# Patient Record
Sex: Female | Born: 1995 | Race: Black or African American | Hispanic: No | Marital: Single | Smoking: Current every day smoker
Health system: Southern US, Community
[De-identification: ages and names within clinical notes are randomized; demographics above are authoritative.]

## PROBLEM LIST (undated history)

## (undated) DIAGNOSIS — F32A Depression, unspecified: Secondary | ICD-10-CM

## (undated) DIAGNOSIS — D219 Benign neoplasm of connective and other soft tissue, unspecified: Secondary | ICD-10-CM

## (undated) DIAGNOSIS — N946 Dysmenorrhea, unspecified: Secondary | ICD-10-CM

## (undated) DIAGNOSIS — Z5189 Encounter for other specified aftercare: Secondary | ICD-10-CM

## (undated) DIAGNOSIS — D649 Anemia, unspecified: Secondary | ICD-10-CM

## (undated) HISTORY — DX: Dysmenorrhea, unspecified: N94.6

## (undated) HISTORY — DX: Depression, unspecified: F32.A

## (undated) HISTORY — PX: DILATION AND CURETTAGE OF UTERUS: SHX78

## (undated) HISTORY — PX: KNEE SURGERY: SHX244

## (undated) HISTORY — DX: Anemia, unspecified: D64.9

## (undated) HISTORY — DX: Benign neoplasm of connective and other soft tissue, unspecified: D21.9

## (undated) HISTORY — DX: Encounter for other specified aftercare: Z51.89

---

## 2019-05-06 ENCOUNTER — Encounter (HOSPITAL_COMMUNITY): Payer: Self-pay

## 2019-05-06 ENCOUNTER — Other Ambulatory Visit: Payer: Self-pay

## 2019-05-06 ENCOUNTER — Ambulatory Visit (HOSPITAL_COMMUNITY)
Admission: EM | Admit: 2019-05-06 | Discharge: 2019-05-06 | Disposition: A | Discharge status: Home / Self Care | Payer: Self-pay | Attending: Emergency Medicine | Admitting: Emergency Medicine

## 2019-05-06 DIAGNOSIS — R0602 Shortness of breath: Secondary | ICD-10-CM | POA: Insufficient documentation

## 2019-05-06 DIAGNOSIS — F17290 Nicotine dependence, other tobacco product, uncomplicated: Secondary | ICD-10-CM | POA: Insufficient documentation

## 2019-05-06 DIAGNOSIS — R062 Wheezing: Secondary | ICD-10-CM | POA: Insufficient documentation

## 2019-05-06 DIAGNOSIS — J209 Acute bronchitis, unspecified: Secondary | ICD-10-CM | POA: Insufficient documentation

## 2019-05-06 DIAGNOSIS — Z20828 Contact with and (suspected) exposure to other viral communicable diseases: Secondary | ICD-10-CM | POA: Insufficient documentation

## 2019-05-06 MED ORDER — ALBUTEROL SULFATE HFA 108 (90 BASE) MCG/ACT IN AERS
INHALATION_SPRAY | RESPIRATORY_TRACT | Status: AC
  Filled 2019-05-06: qty 6.7

## 2019-05-06 MED ORDER — BENZONATATE 100 MG PO CAPS: 100.0000 mg | ORAL_CAPSULE | Freq: Three times a day (TID) | ORAL | 0 refills | Status: DC | PRN

## 2019-05-06 MED ORDER — PREDNISONE 20 MG PO TABS: 20.0000 mg | ORAL_TABLET | Freq: Every day | ORAL | 0 refills | Status: AC

## 2019-05-06 MED ORDER — ALBUTEROL SULFATE HFA 108 (90 BASE) MCG/ACT IN AERS
2.0000 | INHALATION_SPRAY | Freq: Once | RESPIRATORY_TRACT | Status: AC
  Administered 2019-05-06: 2 via RESPIRATORY_TRACT

## 2019-05-06 NOTE — ED Provider Notes (Signed)
[facility]    CSN: [id number] Arrival date & time: 05/06/19  1231      History   Chief Complaint Chief Complaint  Patient presents with  . Cough    HPI Danielle Foster is a 23 y.o. female.   HPI  Danielle Foster presents today with wheezing, SOB, and coughing x 4 days. Patient is a smoker and endorses a history of bronchitis infections. She is without primary care and doesn't have an inhaler. Cough is productive at times. Symptoms are exacerbated by laying down and worst at bedtime. She has not measured her temperature at home, has low-grade fever here today. Denies any changes to appetite, headache, or N&V. She has also continued to smoke while symptoms have been present. No known sick contacts. Consents to COVID-19 testing.   History reviewed. No pertinent past medical history.  There are no active problems to display for this patient.   History reviewed. No pertinent surgical history.  OB History   No obstetric history on file.      Home Medications    Prior to Admission medications   Not on File    Family History Family History  Problem Relation Age of Onset  . Healthy Mother   . Healthy Father     Social History Social History   Tobacco Use  . Smoking status: Current Every Day Smoker    Types: Cigars  . Smokeless tobacco: Never Used  . Tobacco comment: black and milds  Substance Use Topics  . Alcohol use: Yes    Comment: socailly  . Drug use: Not on file     Allergies   Patient has no known allergies.   Review of Systems Review of Systems Pertinent negatives listed in HPI  Physical Exam Triage Vital Signs ED Triage Vitals  Enc Vitals Group     BP 05/06/19 1257 (!) 137/92     Pulse Rate 05/06/19 1257 (!) 102     Resp 05/06/19 1257 16     Temp 05/06/19 1257 99.3 F (37.4 C)     Temp Source 05/06/19 1257 Temporal     SpO2 05/06/19 1257 96 %     Weight --      Height --      Head Circumference --      Peak Flow --    Pain Score 05/06/19 1255 0     Pain Loc --      Pain Edu? --      Excl. in [place]? --    No data found.  Updated Vital Signs BP (!) 137/92 (BP Location: Left Arm)   Pulse (!) 102   Temp 99.3 F (37.4 C) (Temporal)   Resp 16   SpO2 96%   Visual Acuity Right Eye Distance:   Left Eye Distance:   Bilateral Distance:    Right Eye Near:   Left Eye Near:    Bilateral Near:     Physical Exam Constitutional:      Appearance: She is obese. She is ill-appearing.  HENT:     Nose: Nose normal.     Mouth/Throat:     Mouth: Mucous membranes are moist.  Cardiovascular:     Rate and Rhythm: Normal rate and regular rhythm.  Pulmonary:     Breath sounds: Wheezing present.     Comments: Diminished breath sound bilaterally.  Hacking cough noted during exam  Chest:     Chest wall: Tenderness present.  Skin:    General: Skin is warm.  Neurological:     General: No focal deficit present.     Mental Status: She is oriented to person, place, and time.  Psychiatric:        Mood and Affect: Mood normal.      UC Treatments / Results  Labs (all labs ordered are listed, but only abnormal results are displayed) Labs Reviewed  NOVEL CORONAVIRUS, NAA (HOSP ORDER, SEND-OUT TO REF LAB; TAT 18-24 HRS)    EKG   Radiology No results found.  Procedures Procedures (including critical care time)  Medications Ordered in UC Medications - No data to display  Initial Impression / Assessment and Plan / UC Course  I have reviewed the triage vital signs and the nursing notes.  Pertinent labs & imaging results that were available during my care of the patient were reviewed by me and considered in my medical decision making (see chart for details).     Acute bronchitis with wheezing. Will treat with short course of low dose prednisone, tessalon pearls for cough, and albuterol inhaler for wheezing. Covid 19 testing pending. Encourages social isolation given symptoms which are present today.  Encouraged smoking cessation at minimal stop smoking while symptoms are present. Provided information to establish with PCP for more appropriate management of asthmas/bronchitis symptoms.  Final Clinical Impressions(s) / UC Diagnoses   Final diagnoses:  Acute bronchitis, unspecified organism  Wheezing   Discharge Instructions   None    ED Prescriptions    Medication Sig Dispense Auth. Provider   predniSONE (DELTASONE) 20 MG tablet Take 1 tablet (20 mg total) by mouth daily with breakfast for 5 days. 5 tablet Scot Jun, FNP   benzonatate (TESSALON) 100 MG capsule Take 1-2 capsules (100-200 mg total) by mouth 3 (three) times daily as needed for cough. 60 capsule Scot Jun, FNP     Controlled Substance Prescriptions [place] Controlled Substance Registry consulted? Not Applicable   Scot Jun, FNP 05/06/19 2352

## 2019-05-06 NOTE — ED Triage Notes (Signed)
Patient presents to Urgent Care with complaints of productive cough and nasal congestion since a few nights ago. Patient reports she thinks she has bronchitis.

## 2019-05-07 LAB — NOVEL CORONAVIRUS, NAA (HOSP ORDER, SEND-OUT TO REF LAB; TAT 18-24 HRS): SARS-CoV-2, NAA: NOT DETECTED

## 2019-06-15 ENCOUNTER — Emergency Department (HOSPITAL_COMMUNITY)
Admission: EM | Admit: 2019-06-15 | Discharge: 2019-06-15 | Disposition: A | Discharge status: Home / Self Care | Payer: Self-pay | Attending: Emergency Medicine | Admitting: Emergency Medicine

## 2019-06-15 ENCOUNTER — Other Ambulatory Visit: Payer: Self-pay

## 2019-06-15 ENCOUNTER — Encounter (HOSPITAL_COMMUNITY): Payer: Self-pay | Admitting: Emergency Medicine

## 2019-06-15 DIAGNOSIS — Z5321 Procedure and treatment not carried out due to patient leaving prior to being seen by health care provider: Secondary | ICD-10-CM | POA: Insufficient documentation

## 2019-06-15 DIAGNOSIS — R2232 Localized swelling, mass and lump, left upper limb: Secondary | ICD-10-CM | POA: Insufficient documentation

## 2019-06-15 NOTE — ED Notes (Signed)
PT reported to this tech that he is leaving. Patient encouraged to stay, patient refused.

## 2019-06-15 NOTE — ED Triage Notes (Signed)
Patient with abscess in left axilla.  She states that the pain has gotten worse over the course of the last three days.  It has gotten larger also.  No drainage from the area.

## 2019-12-21 ENCOUNTER — Encounter (HOSPITAL_COMMUNITY): Payer: Self-pay

## 2019-12-21 ENCOUNTER — Ambulatory Visit (HOSPITAL_COMMUNITY)
Admission: EM | Admit: 2019-12-21 | Discharge: 2019-12-21 | Disposition: A | Discharge status: Home / Self Care | Payer: Self-pay | Attending: Family Medicine | Admitting: Family Medicine

## 2019-12-21 ENCOUNTER — Other Ambulatory Visit: Payer: Self-pay

## 2019-12-21 DIAGNOSIS — L02214 Cutaneous abscess of groin: Secondary | ICD-10-CM

## 2019-12-21 MED ORDER — LIDOCAINE HCL 2 % IJ SOLN
INTRAMUSCULAR | Status: AC
  Filled 2019-12-21: qty 20

## 2019-12-21 MED ORDER — HYDROCODONE-ACETAMINOPHEN 5-325 MG PO TABS: 1.0000 | ORAL_TABLET | Freq: Four times a day (QID) | ORAL | 0 refills | Status: DC | PRN

## 2019-12-21 MED ORDER — IBUPROFEN 800 MG PO TABS: 800.0000 mg | ORAL_TABLET | Freq: Three times a day (TID) | ORAL | 0 refills | Status: DC

## 2019-12-21 MED ORDER — DOXYCYCLINE HYCLATE 100 MG PO CAPS: 100.0000 mg | ORAL_CAPSULE | Freq: Two times a day (BID) | ORAL | 0 refills | Status: AC

## 2019-12-21 NOTE — ED Provider Notes (Signed)
[facility]    CSN: [id number] Arrival date & time: 12/21/19  [id number]      History   Chief Complaint Chief Complaint  Patient presents with  . Abscess    HPI Danielle Foster is a 24 y.o. female a significant past medical history presenting today for evaluation of abscesses.  Patient has had an abscess to her left inner thigh for approximately 1 week.  Over the past couple days she has also developed an abscessed area to her right inner thigh.  Reports area on right is more painful than left at current.  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  Typically drained spontaneously, but these have not.  Denies fevers dizziness or lightheadedness.  HPI  History reviewed. No pertinent past medical history.  There are no problems to display for this patient.   Past Surgical History:  Procedure Laterality Date  . DILATION AND CURETTAGE OF UTERUS    . KNEE SURGERY Left     OB History   No obstetric history on file.      Home Medications    Prior to Admission medications   Medication Sig Start Date End Date Taking? Authorizing Provider  doxycycline (VIBRAMYCIN) 100 MG capsule Take 1 capsule (100 mg total) by mouth 2 (two) times daily for 10 days. 12/21/19 12/31/19  Carrel Leather C, PA-C  HYDROcodone-acetaminophen (NORCO/VICODIN) 5-325 MG tablet Take 1-2 tablets by mouth every 6 (six) hours as needed for severe pain. 12/21/19   Aryav Wimberly C, PA-C  ibuprofen (ADVIL) 800 MG tablet Take 1 tablet (800 mg total) by mouth 3 (three) times daily. 12/21/19   Aurelie Dicenzo, Elesa Hacker, PA-C    Family History Family History  Problem Relation Age of Onset  . Healthy Mother   . Healthy Father     Social History Social History   Tobacco Use  . Smoking status: Current Every Day Smoker    Types: Cigars  . Smokeless tobacco: Never Used  . Tobacco comment: black and milds  Substance Use Topics  . Alcohol use: Yes    Comment: socailly  . Drug use: Never       Allergies   Patient has no known allergies.   Review of Systems Review of Systems  Constitutional: Negative for fatigue and fever.  Eyes: Negative for visual disturbance.  Respiratory: Negative for shortness of breath.   Cardiovascular: Negative for chest pain.  Gastrointestinal: Negative for abdominal pain, nausea and vomiting.  Musculoskeletal: Negative for arthralgias and joint swelling.  Skin: Positive for color change. Negative for rash and wound.  Neurological: Negative for dizziness, weakness, light-headedness and headaches.     Physical Exam Triage Vital Signs ED Triage Vitals  Enc Vitals Group     BP 12/21/19 0918 (!) 141/82     Pulse Rate 12/21/19 0916 (!) 127     Resp 12/21/19 0916 18     Temp 12/21/19 0916 98 F (36.7 C)     Temp Source 12/21/19 0916 Oral     SpO2 12/21/19 0916 98 %     Weight 12/21/19 0919 230 lb (104.3 kg)     Height 12/21/19 0919 5\' 5"  (1.651 m)     Head Circumference --      Peak Flow --      Pain Score 12/21/19 0918 10     Pain Loc --      Pain Edu? --      Excl. in [place]? --    No  data found.  Updated Vital Signs BP (!) 141/82   Pulse (!) 127   Temp 98 F (36.7 C) (Oral)   Resp 18   Ht 5\' 5"  (1.651 m)   Wt 230 lb (104.3 kg)   SpO2 98%   BMI 38.27 kg/m   Visual Acuity Right Eye Distance:   Left Eye Distance:   Bilateral Distance:    Right Eye Near:   Left Eye Near:    Bilateral Near:     Physical Exam Vitals and nursing note reviewed.  Constitutional:      Appearance: She is well-developed.     Comments: No acute distress  HENT:     Head: Normocephalic and atraumatic.     Nose: Nose normal.  Eyes:     Conjunctiva/sclera: Conjunctivae normal.  Cardiovascular:     Rate and Rhythm: Tachycardia present.  Pulmonary:     Effort: Pulmonary effort is normal. No respiratory distress.  Abdominal:     General: There is no distension.  Musculoskeletal:        General: Normal range of motion.     Cervical back:  Neck supple.  Skin:    General: Skin is warm and dry.     Comments: Bilateral proximal inner thighs with areas of induration, area on left with central area of mild fluctuance, spontaneously draining pustular bloody material  Right area with 2 distinct areas of superficial fluctuance with significant surrounding tenderness  Neurological:     Mental Status: She is alert and oriented to person, place, and time.      UC Treatments / Results  Labs (all labs ordered are listed, but only abnormal results are displayed) Labs Reviewed - No data to display  EKG   Radiology No results found.  Procedures Procedures (including critical care time)  Medications Ordered in UC Medications - No data to display  Initial Impression / Assessment and Plan / UC Course  I have reviewed the triage vital signs and the nursing notes.  Pertinent labs & imaging results that were available during my care of the patient were reviewed by me and considered in my medical decision making (see chart for details).     Recommended I&D of these areas today, patient initially agreeable, but when further discussing procedural anesthetic, patient wished to defer and begin antibiotics with warm compresses and close monitoring over the next couple days to see if they will spontaneously drain.  Apologies with this request, advised if symptoms still not improving or symptoms worsening, developing fevers, dizziness or lightheadedness she would need to return.  Provided hydrocodone for severe pain.  Discussed risks associated with this.  Discussed strict return precautions. Patient verbalized understanding and is agreeable with plan.  Final Clinical Impressions(s) / UC Diagnoses   Final diagnoses:  Abscess of groin, right  Abscess of groin, left     Discharge Instructions     Please begin doxycycline for 10 days  Apply warm compresses/hot rags to area with massage to express further drainage especially the  first 24-48 hours  Use anti-inflammatories for pain/swelling. You may take up to 800 mg Ibuprofen every 8 hours with food. You may supplement Ibuprofen with Tylenol [phone] mg every 8 hours.   Hydrocodone for severe pain- do not drive/work after taking  Return if symptoms returning or not improving   ED Prescriptions    Medication Sig Dispense Auth. Provider   doxycycline (VIBRAMYCIN) 100 MG capsule Take 1 capsule (100 mg total) by mouth 2 (two) times  daily for 10 days. 20 capsule Rai Sinagra C, PA-C   HYDROcodone-acetaminophen (NORCO/VICODIN) 5-325 MG tablet Take 1-2 tablets by mouth every 6 (six) hours as needed for severe pain. 10 tablet Jourdon Zimmerle C, PA-C   ibuprofen (ADVIL) 800 MG tablet Take 1 tablet (800 mg total) by mouth 3 (three) times daily. 21 tablet Janessa Mickle, [place] C, PA-C     I have reviewed the PDMP during this encounter.   Janith Lima, PA-C 12/21/19 1046

## 2019-12-21 NOTE — Discharge Instructions (Signed)
Please begin doxycycline for 10 days  Apply warm compresses/hot rags to area with massage to express further drainage especially the first 24-48 hours  Use anti-inflammatories for pain/swelling. You may take up to 800 mg Ibuprofen every 8 hours with food. You may supplement Ibuprofen with Tylenol [phone] mg every 8 hours.   Hydrocodone for severe pain- do not drive/work after taking  Return if symptoms returning or not improving

## 2019-12-21 NOTE — ED Triage Notes (Signed)
Pt c/o abscess on right and left inner thighsx1 wk. Pt states this a recurrent situation. Pt shuffled to exam room.

## 2020-10-06 ENCOUNTER — Other Ambulatory Visit: Payer: Self-pay

## 2020-10-06 ENCOUNTER — Encounter (HOSPITAL_COMMUNITY): Payer: Self-pay | Admitting: Emergency Medicine

## 2020-10-06 ENCOUNTER — Ambulatory Visit: Payer: Self-pay

## 2020-10-06 ENCOUNTER — Ambulatory Visit (HOSPITAL_COMMUNITY)
Admission: EM | Admit: 2020-10-06 | Discharge: 2020-10-06 | Disposition: A | Discharge status: Home / Self Care | Payer: HRSA Program | Attending: Emergency Medicine | Admitting: Emergency Medicine

## 2020-10-06 DIAGNOSIS — Z20822 Contact with and (suspected) exposure to covid-19: Secondary | ICD-10-CM | POA: Diagnosis not present

## 2020-10-06 DIAGNOSIS — F17210 Nicotine dependence, cigarettes, uncomplicated: Secondary | ICD-10-CM | POA: Insufficient documentation

## 2020-10-06 DIAGNOSIS — Z3202 Encounter for pregnancy test, result negative: Secondary | ICD-10-CM | POA: Insufficient documentation

## 2020-10-06 DIAGNOSIS — R197 Diarrhea, unspecified: Secondary | ICD-10-CM | POA: Diagnosis present

## 2020-10-06 LAB — POC URINE PREG, ED: Preg Test, Ur: NEGATIVE

## 2020-10-06 NOTE — ED Provider Notes (Signed)
[facility]    CSN: [id number] Arrival date & time: 10/06/20  1016      History   Chief Complaint Chief Complaint  Patient presents with  . Diarrhea    HPI Danielle Foster is a 25 y.o. female.   Patient presents with two episodes of watery diarrhea on 2/11. Resolved on 2/12. Denies fever, body aches, chills, nausea, vomiting or respiratory involvement. Needs COVID testing to return to work.  Patient LMP 2/15. Missed period for January, describes normal cycle as regular. Denies symptoms of pregnancy  History reviewed. No pertinent past medical history.  There are no problems to display for this patient.   Past Surgical History:  Procedure Laterality Date  . DILATION AND CURETTAGE OF UTERUS    . KNEE SURGERY Left     OB History   No obstetric history on file.      Home Medications    Prior to Admission medications   Medication Sig Start Date End Date Taking? Authorizing Provider  HYDROcodone-acetaminophen (NORCO/VICODIN) 5-325 MG tablet Take 1-2 tablets by mouth every 6 (six) hours as needed for severe pain. 12/21/19   Wieters, Hallie C, PA-C  ibuprofen (ADVIL) 800 MG tablet Take 1 tablet (800 mg total) by mouth 3 (three) times daily. 12/21/19   Wieters, Elesa Hacker, PA-C    Family History Family History  Problem Relation Age of Onset  . Healthy Mother   . Healthy Father     Social History Social History   Tobacco Use  . Smoking status: Current Every Day Smoker    Types: Cigars  . Smokeless tobacco: Never Used  . Tobacco comment: black and milds  Vaping Use  . Vaping Use: Never used  Substance Use Topics  . Alcohol use: Yes    Comment: socailly  . Drug use: Never     Allergies   Patient has no known allergies.   Review of Systems Review of Systems   Physical Exam Triage Vital Signs ED Triage Vitals  Enc Vitals Group     BP 10/06/20 1125 136/84     Pulse Rate 10/06/20 1125 91     Resp 10/06/20 1125 18     Temp 10/06/20 1125 98.7  F (37.1 C)     Temp Source 10/06/20 1125 Oral     SpO2 10/06/20 1125 100 %     Weight --      Height --      Head Circumference --      Peak Flow --      Pain Score 10/06/20 1123 0     Pain Loc --      Pain Edu? --      Excl. in [place]? --    No data found.  Updated Vital Signs BP 136/84 (BP Location: Left Arm)   Pulse 91   Temp 98.7 F (37.1 C) (Oral)   Resp 18   LMP 08/06/2020   SpO2 100%   Visual Acuity Right Eye Distance:   Left Eye Distance:   Bilateral Distance:    Right Eye Near:   Left Eye Near:    Bilateral Near:     Physical Exam   UC Treatments / Results  Labs (all labs ordered are listed, but only abnormal results are displayed) Labs Reviewed  SARS CORONAVIRUS 2 (TAT 6-24 HRS)  POC URINE PREG, ED    EKG   Radiology No results found.  Procedures Procedures (including critical care time)  Medications Ordered in UC Medications -  No data to display  Initial Impression / Assessment and Plan / UC Course  I have reviewed the triage vital signs and the nursing notes.  Pertinent labs & imaging results that were available during my care of the patient were reviewed by me and considered in my medical decision making (see chart for details).  Diarrhea unspecified source  1. COVID test- pending 2. Pregnancy test- negative 3. Follow up UC or gynecology for retesting if menstrual cycle missed for February, follow up with gynecology for further workup of amenorrhea  Final Clinical Impressions(s) / UC Diagnoses   Final diagnoses:  None   Discharge Instructions   None    ED Prescriptions    None     PDMP not reviewed this encounter.   Hans Eden, NP 10/06/20 1145

## 2020-10-06 NOTE — Discharge Instructions (Addendum)
Lab results in about 24 hours, results will populate to MyChart if active. You will  receive a call if testing positive.   Pregnancy test negative- can follow up with Urgent Care or Gynecology for retest  or further evaluation if February missed,

## 2020-10-06 NOTE — ED Triage Notes (Signed)
Pt presents with diarrhea. States has two episodes and was sent by job to have COVID tests.   Also would like pregnancy test due to last period being 08/06/20

## 2020-10-07 LAB — SARS CORONAVIRUS 2 (TAT 6-24 HRS): SARS Coronavirus 2: NEGATIVE

## 2021-02-16 ENCOUNTER — Emergency Department (HOSPITAL_COMMUNITY)
Admission: EM | Admit: 2021-02-16 | Discharge: 2021-02-16 | Disposition: A | Discharge status: Home / Self Care | Payer: Self-pay | Attending: Emergency Medicine | Admitting: Emergency Medicine

## 2021-02-16 ENCOUNTER — Emergency Department (HOSPITAL_COMMUNITY): Payer: Self-pay

## 2021-02-16 DIAGNOSIS — F17290 Nicotine dependence, other tobacco product, uncomplicated: Secondary | ICD-10-CM | POA: Insufficient documentation

## 2021-02-16 DIAGNOSIS — D649 Anemia, unspecified: Secondary | ICD-10-CM | POA: Insufficient documentation

## 2021-02-16 DIAGNOSIS — R102 Pelvic and perineal pain: Secondary | ICD-10-CM | POA: Insufficient documentation

## 2021-02-16 DIAGNOSIS — R110 Nausea: Secondary | ICD-10-CM | POA: Insufficient documentation

## 2021-02-16 LAB — COMPREHENSIVE METABOLIC PANEL
ALT: 18 U/L (ref 0–44)
AST: 15 U/L (ref 15–41)
Albumin: 3.6 g/dL (ref 3.5–5.0)
Alkaline Phosphatase: 57 U/L (ref 38–126)
Anion gap: 7 (ref 5–15)
BUN: 11 mg/dL (ref 6–20)
CO2: 24 mmol/L (ref 22–32)
Calcium: 9.7 mg/dL (ref 8.9–10.3)
Chloride: 106 mmol/L (ref 98–111)
Creatinine, Ser: 0.68 mg/dL (ref 0.44–1.00)
GFR, Estimated: >60 mL/min (ref >60)
Glucose, Bld: 117 mg/dL — ABNORMAL HIGH (ref 70–99)
Potassium: 3.8 mmol/L (ref 3.5–5.1)
Sodium: 137 mmol/L (ref 135–145)
Total Bilirubin: 0.3 mg/dL (ref 0.3–1.2)
Total Protein: 8.1 g/dL (ref 6.5–8.1)

## 2021-02-16 LAB — GC/CHLAMYDIA PROBE AMP (~~LOC~~) NOT AT ARMC
Chlamydia: NEGATIVE
Comment: NEGATIVE
Comment: NORMAL
Neisseria Gonorrhea: NEGATIVE

## 2021-02-16 LAB — CBC WITH DIFFERENTIAL/PLATELET
Abs Immature Granulocytes: 0.06 10*3/uL (ref 0.00–0.07)
Basophils Absolute: 0.1 10*3/uL (ref 0.0–0.1)
Basophils Relative: 0 %
Eosinophils Absolute: 0 10*3/uL (ref 0.0–0.5)
Eosinophils Relative: 0 %
HCT: 36.6 % (ref 36.0–46.0)
Hemoglobin: 11.8 g/dL — ABNORMAL LOW (ref 12.0–15.0)
Immature Granulocytes: 0 %
Lymphocytes Relative: 13 %
Lymphs Abs: 2.2 10*3/uL (ref 0.7–4.0)
MCH: 29.3 pg (ref 26.0–34.0)
MCHC: 32.2 g/dL (ref 30.0–36.0)
MCV: 90.8 fL (ref 80.0–100.0)
Monocytes Absolute: 0.8 10*3/uL (ref 0.1–1.0)
Monocytes Relative: 5 %
Neutro Abs: 13.4 10*3/uL — ABNORMAL HIGH (ref 1.7–7.7)
Neutrophils Relative %: 82 %
Platelets: 397 10*3/uL (ref 150–400)
RBC: 4.03 MIL/uL (ref 3.87–5.11)
RDW: 15.9 % — ABNORMAL HIGH (ref 11.5–15.5)
WBC: 16.6 10*3/uL — ABNORMAL HIGH (ref 4.0–10.5)
nRBC: 0 % (ref 0.0–0.2)

## 2021-02-16 LAB — URINALYSIS, ROUTINE W REFLEX MICROSCOPIC
Bacteria, UA: NONE SEEN
Bilirubin Urine: NEGATIVE
Glucose, UA: NEGATIVE mg/dL
Hgb urine dipstick: NEGATIVE
Ketones, ur: 5 mg/dL — AB
Leukocytes,Ua: NEGATIVE
Nitrite: NEGATIVE
Protein, ur: NEGATIVE mg/dL
Specific Gravity, Urine: 1.031 — ABNORMAL HIGH (ref 1.005–1.030)
pH: 6 (ref 5.0–8.0)

## 2021-02-16 LAB — WET PREP, GENITAL
Sperm: NONE SEEN
Trich, Wet Prep: NONE SEEN
WBC, Wet Prep HPF POC: NONE SEEN
Yeast Wet Prep HPF POC: NONE SEEN

## 2021-02-16 LAB — RPR: RPR Ser Ql: NONREACTIVE

## 2021-02-16 LAB — I-STAT BETA HCG BLOOD, ED (MC, WL, AP ONLY): I-stat hCG, quantitative: <5 m[IU]/mL (ref <5)

## 2021-02-16 LAB — HIV ANTIBODY (ROUTINE TESTING W REFLEX): HIV Screen 4th Generation wRfx: NONREACTIVE

## 2021-02-16 MED ORDER — ONDANSETRON HCL 4 MG/2ML IJ SOLN
4.0000 mg | Freq: Once | INTRAMUSCULAR | Status: AC
  Administered 2021-02-16: 4 mg via INTRAVENOUS
  Filled 2021-02-16: qty 2

## 2021-02-16 MED ORDER — ONDANSETRON HCL 4 MG PO TABS: 4.0000 mg | ORAL_TABLET | Freq: Four times a day (QID) | ORAL | 0 refills | Status: DC | PRN

## 2021-02-16 MED ORDER — KETOROLAC TROMETHAMINE 30 MG/ML IJ SOLN
30.0000 mg | Freq: Once | INTRAMUSCULAR | Status: AC
  Administered 2021-02-16: 30 mg via INTRAVENOUS
  Filled 2021-02-16: qty 1

## 2021-02-16 MED ORDER — ONDANSETRON 8 MG PO TBDP
8.0000 mg | ORAL_TABLET | Freq: Once | ORAL | Status: AC
  Administered 2021-02-16: 8 mg via ORAL
  Filled 2021-02-16: qty 1

## 2021-02-16 NOTE — ED Provider Notes (Signed)
[facility] DEPT Provider Note   CSN: [id number] Arrival date & time: 02/16/21  0126     History Chief Complaint  Patient presents with   Abdominal Pain    Danielle Foster is a 25 y.o. female.  The history is provided by the patient.  Abdominal Pain She had sudden onset at about midnight of severe pain across the suprapubic area and radiating to the back.  Pain was rated at 10/10.  Nothing made it better, nothing made it worse.  There was associated nausea without vomiting.  Pain has improved and is now down to 5/10.  Onset was during sexual intercourse.  She has never had pain like this before.  Last menses was 12/26/2020.  Patient states that she feels like her menses are about to come on, but she has not started bleeding yet.  She does use condoms for contraception, but not consistently.  She is gravida 1 para 0 with 1 miscarriage.   No past medical history on file.  There are no problems to display for this patient.   Past Surgical History:  Procedure Laterality Date   DILATION AND CURETTAGE OF UTERUS     KNEE SURGERY Left      OB History   No obstetric history on file.     Family History  Problem Relation Age of Onset   Healthy Mother    Healthy Father     Social History   Tobacco Use   Smoking status: Every Day    Pack years: 0.00    Types: Cigars   Smokeless tobacco: Never   Tobacco comments:    black and milds  Vaping Use   Vaping Use: Never used  Substance Use Topics   Alcohol use: Yes    Comment: socailly   Drug use: Never    Home Medications Prior to Admission medications   Medication Sig Start Date End Date Taking? Authorizing Provider  HYDROcodone-acetaminophen (NORCO/VICODIN) 5-325 MG tablet Take 1-2 tablets by mouth every 6 (six) hours as needed for severe pain. 12/21/19   Wieters, Hallie C, PA-C  ibuprofen (ADVIL) 800 MG tablet Take 1 tablet (800 mg total) by mouth 3 (three) times daily. 12/21/19   Wieters, Hallie  C, PA-C    Allergies    Patient has no known allergies.  Review of Systems   Review of Systems  Gastrointestinal:  Positive for abdominal pain.  All other systems reviewed and are negative.  Physical Exam Updated Vital Signs BP 131/83 (BP Location: Left Arm)   Pulse 95   Temp 98.1 F (36.7 C) (Oral)   Resp 15   Ht 5\' 5"  (1.651 m)   Wt 104.3 kg   LMP 12/29/2020   SpO2 99%   BMI 38.27 kg/m   Physical Exam Vitals and nursing note reviewed.  Morbidly obese 25year old female, resting comfortably and in no acute distress. Vital signs are normal. Oxygen saturation is 99%, which is normal. Head is normocephalic and atraumatic. PERRLA, EOMI. Oropharynx is clear. Neck is nontender and supple without adenopathy or JVD. Back is nontender and there is no CVA tenderness. Lungs are clear without rales, wheezes, or rhonchi. Chest is nontender. Heart has regular rate and rhythm without murmur. Abdomen is soft, flat, with marked suprapubic tenderness.  Tenderness extends all the way across the suprapubic area.  There is no rebound or guarding.  There are no masses or hepatosplenomegaly and peristalsis is hypoactive. Pelvic: Normal external female genitalia.  Cervix is closed.  No bleeding seen.  No cervical motion tenderness.  Fundus normal size and position.  Bilateral adnexal tenderness which is significantly greater on the right.  Difficult to assess for adnexal masses due to body habitus. Extremities have no cyanosis or edema, full range of motion is present. Skin is warm and dry without rash. Neurologic: Mental status is normal, cranial nerves are intact, there are no motor or sensory deficits.  ED Results / Procedures / Treatments   Labs (all labs ordered are listed, but only abnormal results are displayed) Labs Reviewed  WET PREP, GENITAL - Abnormal; Notable for the following components:      Result Value   Clue Cells Wet Prep HPF POC PRESENT (*)    All other components within  normal limits  COMPREHENSIVE METABOLIC PANEL - Abnormal; Notable for the following components:   Glucose, Bld 117 (*)    All other components within normal limits  CBC WITH DIFFERENTIAL/PLATELET - Abnormal; Notable for the following components:   WBC 16.6 (*)    Hemoglobin 11.8 (*)    RDW 15.9 (*)    Neutro Abs 13.4 (*)    All other components within normal limits  URINALYSIS, ROUTINE W REFLEX MICROSCOPIC - Abnormal; Notable for the following components:   APPearance HAZY (*)    Specific Gravity, Urine 1.031 (*)    Ketones, ur 5 (*)    All other components within normal limits  RPR  HIV ANTIBODY (ROUTINE TESTING W REFLEX)  I-STAT BETA HCG BLOOD, ED ([facility], [facility], [facility] ONLY)  GC/CHLAMYDIA PROBE AMP ([place]) NOT AT [facility]    Radiology US PELVIC COMPLETE W TRANSVAGINAL AND TORSION R/O  Result Date: 02/16/2021 CLINICAL DATA:  Pelvic pain in female. EXAM: TRANSABDOMINAL AND TRANSVAGINAL ULTRASOUND OF PELVIS DOPPLER ULTRASOUND OF OVARIES TECHNIQUE: Both transabdominal and transvaginal ultrasound examinations of the pelvis were performed. Transabdominal technique was performed for global imaging of the pelvis including uterus, ovaries, adnexal regions, and pelvic cul-de-sac. It was necessary to proceed with endovaginal exam following the transabdominal exam to visualize the ovaries. Color and duplex Doppler ultrasound was utilized to evaluate blood flow to the ovaries. COMPARISON:  None. FINDINGS: The technologist worksheet is not available for review. Uterus Measurements: 8 x 4 x 4 cm = volume: 70 mL. Intramural hypoechoic mass consistent with fibroid in the right body, 13 mm in diameter Endometrium Thickness: 9 mm.  No focal abnormality visualized. Right ovary Measurements: 44 x 32 x 34 mm = volume: 24 mL. 2.5 cm Crenulated hypoechoic structure with internal lace-like architecture. There is an intermittently seen echogenic rim. Left ovary Measurements: 32 x 19 x 19 mm = volume: 6 mL. Normal  appearance/no adnexal mass. Pulsed Doppler evaluation of both ovaries demonstrates normal low-resistance arterial and venous waveforms. Other findings No abnormal free fluid. IMPRESSION: 1. No acute finding. 2. Hemorrhagic corpus luteum on the right. 3. 13 mm intramural fibroid. Electronically Signed   By: Monte Fantasia M.D.   On: 02/16/2021 05:38    Procedures Procedures   Medications Ordered in ED Medications  ondansetron (ZOFRAN-ODT) disintegrating tablet 8 mg (has no administration in time range)  ondansetron (ZOFRAN) injection 4 mg (4 mg Intravenous Given 02/16/21 0317)  ketorolac (TORADOL) 30 MG/ML injection 30 mg (30 mg Intravenous Given 02/16/21 8563)    ED Course  I have reviewed the triage vital signs and the nursing notes.  Pertinent labs & imaging results that were available during my care of the patient were reviewed by me and considered in  my medical decision making (see chart for details).   MDM Rules/Calculators/A&P                         Pelvic pain onset during sexual intercourse.  Likely ruptured ovarian cyst.  Consider ruptured ectopic pregnancy, ovarian torsion.  Will check pregnancy test.  She will be given ondansetron and ketorolac.  Old records are reviewed, and she has no relevant past visits.  There is modest relief with ketorolac.  Pregnancy test is negative.  Other labs are significant for mild leukocytosis and mild anemia.  She will be sent for pelvic ultrasound.  Pelvic ultrasound shows a hemorrhagic right corpus luteum cyst.  This is not large enough to account for her symptoms.  However, on reevaluation, patient states that pain has almost completely gone.  She is complaining of some ongoing nausea and is given a dose of ondansetron.  She is discharged with [facility], prescription given for ondansetron.  Return precautions discussed.  Final Clinical Impression(s) / ED Diagnoses Final diagnoses:  Pelvic pain  in female  Acute pelvic pain, female  Nausea  Normochromic normocytic anemia    Rx / DC Orders ED Discharge Orders          Ordered    ondansetron (ZOFRAN) 4 MG tablet  Every 6 hours PRN        02/16/21 9201             Delora Fuel, MD 00/71/21 [phone]

## 2021-02-16 NOTE — ED Triage Notes (Signed)
Pt came in with c/o lower abdominal/pelvic pain that radiates under her pannus. Pt also endorses lower back pain. Pt is currently sexually active. States it started an hour ago

## 2021-02-16 NOTE — Discharge Instructions (Addendum)
You may take ibuprofen or naproxen as needed for pain.  If you need additional pain relief, you may take acetaminophen in addition to ibuprofen or naproxen.  Return if your pain is getting worse.

## 2021-03-03 ENCOUNTER — Other Ambulatory Visit (HOSPITAL_COMMUNITY)
Admission: RE | Admit: 2021-03-03 | Discharge: 2021-03-03 | Disposition: A | Discharge status: Home / Self Care | Payer: Self-pay | Source: Ambulatory Visit | Attending: Obstetrics and Gynecology | Admitting: Obstetrics and Gynecology

## 2021-03-03 ENCOUNTER — Encounter: Payer: Self-pay | Admitting: Obstetrics and Gynecology

## 2021-03-03 ENCOUNTER — Ambulatory Visit (INDEPENDENT_AMBULATORY_CARE_PROVIDER_SITE_OTHER): Payer: PRIVATE HEALTH INSURANCE | Admitting: Obstetrics and Gynecology

## 2021-03-03 ENCOUNTER — Other Ambulatory Visit: Payer: Self-pay

## 2021-03-03 VITALS — BP 134/76 | HR 94 | Ht 65.0 in | Wt 232.0 lb

## 2021-03-03 DIAGNOSIS — Z23 Encounter for immunization: Secondary | ICD-10-CM | POA: Diagnosis not present

## 2021-03-03 DIAGNOSIS — Z6838 Body mass index (BMI) 38.0-38.9, adult: Secondary | ICD-10-CM

## 2021-03-03 DIAGNOSIS — Z3009 Encounter for other general counseling and advice on contraception: Secondary | ICD-10-CM

## 2021-03-03 DIAGNOSIS — Z8742 Personal history of other diseases of the female genital tract: Secondary | ICD-10-CM

## 2021-03-03 DIAGNOSIS — D251 Intramural leiomyoma of uterus: Secondary | ICD-10-CM

## 2021-03-03 DIAGNOSIS — Z7185 Encounter for immunization safety counseling: Secondary | ICD-10-CM | POA: Diagnosis not present

## 2021-03-03 DIAGNOSIS — Z124 Encounter for screening for malignant neoplasm of cervix: Secondary | ICD-10-CM | POA: Insufficient documentation

## 2021-03-03 DIAGNOSIS — Z01419 Encounter for gynecological examination (general) (routine) without abnormal findings: Secondary | ICD-10-CM | POA: Diagnosis not present

## 2021-03-03 DIAGNOSIS — D72828 Other elevated white blood cell count: Secondary | ICD-10-CM

## 2021-03-03 DIAGNOSIS — L732 Hidradenitis suppurativa: Secondary | ICD-10-CM

## 2021-03-03 DIAGNOSIS — Z0000 Encounter for general adult medical examination without abnormal findings: Secondary | ICD-10-CM

## 2021-03-03 MED ORDER — CLINDAMYCIN PHOSPHATE 1 % EX GEL: Freq: Two times a day (BID) | CUTANEOUS | 2 refills | Status: DC

## 2021-03-03 NOTE — Progress Notes (Signed)
25 y.o. No obstetric history on file. Single Black or African American Not Hispanic or Latino female here for fibroids found on ultrasound.   The patient was seen in the ER on 02/16/21 with severe lower abdominal pain. The pain started with intercourse.  Ultrasound showed a hemorrhagic CL on the right and a 13 mm intramural myoma. The pain was felt to be from a ruptured cyst. They sent her home with nausea medication and ibuprofen and told her to f/u with gyn.  Labs from 02/16/21 reviewed. GC/CT negative CMP normal WBC was 16.6 Hgb 11.8 RPR non reactive HIV non reactive BhcG <5 Wet prep + clue She denies symptoms of BV and wasn't treated.  Ultrasound images from her her visit on 02/16/21 were reviewed with the patient IMPRESSION: 1. No acute finding. 2. Hemorrhagic corpus luteum on the right. 3. 13 mm intramural fibroid.  By my review of the ultrasound the fibroid is intramural/subserosal.   Since she was in the ER the pain has eased up. She still has mild, intermittent discomfort/cramping. The pain is in the suprapubic region. She can function with the pain. She has been sexually active since her ER visit without pain. Her cycle after the ER visit was heavier and longer than normal. She was saturating a super + tampon in 2-3 hours (typically every 6 hours). She bleed for 7 days instead of her typical 5.   Sexually active, same partner x 1 year. Using condoms sometimes. She was on depo-provera in the past, bleeding lasted for months. She was on OCP's in the past, did okay on it.  She would be okay if she got pregnant.   She c/o recurrent issues with boils under her arms in her groin and sp region. She has scarring from it.    Period Cycle (Days): 28-30 Period Duration (Days): 5 Period Pattern: Regular Menstrual Flow: Heavy Menstrual Control: Super Tampon, Panty liner Menstrual Control Change Freq (Hours): 6 Dysmenorrhea: mild Dysmenorrhea Symptoms: Cramping, Nausea  Prior to the  last year she would have a cycle every 2-3 months. Monthly for the last year.   Patient's last menstrual period was 02/17/2021.          Sexually active: Yes.    The current method of family planning is none.    Exercising: No.  The patient does not participate in regular exercise at present. Smoker:  yes  ~3 cigars a day.    Health Maintenance: Pap:  years ago History of abnormal Pap:  never had one  MMG:  none  BMD:   none  Colonoscopy: none  TDaP:  up to date  Gardasil: unsure    reports that she has been smoking cigars. She has never used smokeless tobacco. She reports current alcohol use. She reports that she does not use drugs. 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   No past medical history on file.  Past Surgical History:  Procedure Laterality Date   DILATION AND CURETTAGE OF UTERUS     KNEE SURGERY Left     Current Outpatient Medications  Medication Sig Dispense Refill   ibuprofen (ADVIL) 200 MG tablet Take 200 mg by mouth every 6 (six) hours as needed for moderate pain or mild pain.     No current facility-administered medications for this visit.    Family History  Problem Relation Age of Onset   Healthy Mother    Healthy Father   States there is a FH of HTN  and cancer.   Review of Systems  Genitourinary:  Positive for menstrual problem and pelvic pain.  All other systems reviewed and are negative.  Exam:   BP 134/76   Pulse 94   Ht 5\' 5"  (1.651 m)   Wt 232 lb (105.2 kg)   LMP 02/17/2021   SpO2 99%   BMI 38.61 kg/m   Weight change: @WEIGHTCHANGE @ Height:   Height: 5\' 5"  (165.1 cm)  Ht Readings from Last 3 Encounters:  03/03/21 5\' 5"  (1.651 m)  02/16/21 5\' 5"  (1.651 m)  12/21/19 5\' 5"  (1.651 m)    General appearance: alert, cooperative and appears stated age Head: Normocephalic, without obvious abnormality, atraumatic Neck: no adenopathy, supple, symmetrical, trachea midline and thyroid normal to inspection and palpation and  top normal sized Lungs: clear to auscultation bilaterally Cardiovascular: regular rate and rhythm Breasts: normal appearance, no masses or tenderness, some scarring under the right breast from boils Abdomen: soft, non-tender; non distended,  no masses,  no organomegaly Extremities: extremities normal, atraumatic, no cyanosis or edema Skin: Skin color, texture, turgor normal. She has several areas of resolving boils on her mons, under her right breast, scarring from hidradenitis on upper thighs, bilateral axilla and mons.  Lymph nodes: Cervical, supraclavicular, and axillary nodes normal. No abnormal inguinal nodes palpated Neurologic: Grossly normal   Pelvic: External genitalia:  no lesions              Urethra:  normal appearing urethra with no masses, tenderness or lesions              Bartholins and Skenes: normal                 Vagina: normal appearing vagina with normal color and discharge, no lesions              Cervix: no cervical motion tenderness and no lesions               Bimanual Exam:  Uterus:   no masses or tenderness              Adnexa: no mass, fullness, tenderness               Rectovaginal: Confirms               Anus:  normal sphincter tone, no lesions  Gae Dry chaperoned for the exam.  1. Well woman exam Information on breast self awareness given  2. Screening for cervical cancer - Cytology - PAP  3. Laboratory exam ordered as part of routine general medical examination Recent normal CMP other than mildly elevated glucose - CBC - Lipid panel  4. BMI 38.0-38.9,adult - Hemoglobin A1c - Lipid panel - TSH  5. History of ovarian cyst Hemorrhagic CL. Images reviewed with the patient. Symptoms are resolving, exam is normal Patient reassured  6. Other elevated white blood cell (WBC) count 16 in the ER at the end of last month  7. Immunization counseling - HPV 9-valent vaccine,Recombinat  8. Hidradenitis suppurativa Will start her on clindamycin  gel and refer her to Dermatology. She is not using consistent birth control so I don't think doxycyline is the best option for her.  - clindamycin (CLINDAGEL) 1 % gel; Apply topically 2 (two) times daily.  Dispense: 30 g; Refill: 2  9. General counseling and advice on female contraception Using condoms sometimes, declines other forms of contraception Okay if she gets pregnant Recommended she start on a prenatal vitamin  10.  Intramural leiomyoma of uterus Small, intramural/subserosal. Patient reassured that at this time the fibroid isn't causing any problems

## 2021-03-03 NOTE — Patient Instructions (Addendum)
EXERCISE   We recommended that you start or continue a regular exercise program for good health. Physical activity is anything that gets your body moving, some is better than none. The CDC recommends 150 minutes per week of Moderate-Intensity Aerobic Activity and 2 or more days of Muscle Strengthening Activity.  Benefits of exercise are limitless: helps weight loss/weight maintenance, improves mood and energy, helps with depression and anxiety, improves sleep, tones and strengthens muscles, improves balance, improves bone density, protects from chronic conditions such as heart disease, high blood pressure and diabetes and so much more. To learn more visit: https://www.cdc.gov/physicalactivity/index.html  DIET: Good nutrition starts with a healthy diet of fruits, vegetables, whole grains, and lean protein sources. Drink plenty of water for hydration. Minimize empty calories, sodium, sweets. For more information about dietary recommendations visit: https://health.gov/our-work/nutrition-physical-activity/dietary-guidelines and https://www.myplate.gov/  ALCOHOL:  Women should limit their alcohol intake to no more than 7 drinks/beers/glasses of wine (combined, not each!) per week. Moderation of alcohol intake to this level decreases your risk of breast cancer and liver damage.  If you are concerned that you may have a problem, or your friends have told you they are concerned about your drinking, there are many resources to help. A well-known program that is free, effective, and available to all people all over the nation is Alcoholics Anonymous.  Check out this site to learn more: https://www.aa.org/   CALCIUM AND VITAMIN D:  Adequate intake of calcium and Vitamin D are recommended for bone health.  You should be getting between 1000-1200 mg of calcium and 800 units of Vitamin D daily between diet and supplements  PAP SMEARS:  Pap smears, to check for cervical cancer or precancers,  have traditionally been  done yearly, scientific advances have shown that most women can have pap smears less often.  However, every woman still should have a physical exam from her gynecologist every year. It will include a breast check, inspection of the vulva and vagina to check for abnormal growths or skin changes, a visual exam of the cervix, and then an exam to evaluate the size and shape of the uterus and ovaries. We will also provide age appropriate advice regarding health maintenance, like when you should have certain vaccines, screening for sexually transmitted diseases, bone density testing, colonoscopy, mammograms, etc.   MAMMOGRAMS:  All women over 40 years old should have a routine mammogram.   COLON CANCER SCREENING: Now recommend starting at age 45. At this time colonoscopy is not covered for routine screening until 50. There are take home tests that can be done between 45-49.   COLONOSCOPY:  Colonoscopy to screen for colon cancer is recommended for all women at age 50.  We know, you hate the idea of the prep.  We agree, BUT, having colon cancer and not knowing it is worse!!  Colon cancer so often starts as a polyp that can be seen and removed at colonscopy, which can quite literally save your life!  And if your first colonoscopy is normal and you have no family history of colon cancer, most women don't have to have it again for 10 years.  Once every ten years, you can do something that may end up saving your life, right?  We will be happy to help you get it scheduled when you are ready.  Be sure to check your insurance coverage so you understand how much it will cost.  It may be covered as a preventative service at no cost, but you should check   your particular policy.      Breast Self-Awareness Breast self-awareness means being familiar with how your breasts look and feel. It involves checking your breasts regularly and reporting any changes to your health care provider. Practicing breast self-awareness is  important. A change in your breasts can be a sign of a serious medical problem. Being familiar with how your breasts look and feel allows you to find any problems early, when treatment is more likely to be successful. All women should practice breast self-awareness, including women who have had breast implants. How to do a breast self-exam One way to learn what is normal for your breasts and whether your breasts are changing is to do a breast self-exam. To do a breast self-exam: Look for Changes  Remove all the clothing above your waist. Stand in front of a mirror in a room with good lighting. Put your hands on your hips. Push your hands firmly downward. Compare your breasts in the mirror. Look for differences between them (asymmetry), such as: Differences in shape. Differences in size. Puckers, dips, and bumps in one breast and not the other. Look at each breast for changes in your skin, such as: Redness. Scaly areas. Look for changes in your nipples, such as: Discharge. Bleeding. Dimpling. Redness. A change in position. Feel for Changes Carefully feel your breasts for lumps and changes. It is best to do this while lying on your back on the floor and again while sitting or standing in the shower or tub with soapy water on your skin. Feel each breast in the following way: Place the arm on the side of the breast you are examining above your head. Feel your breast with the other hand. Start in the nipple area and make  inch (2 cm) overlapping circles to feel your breast. Use the pads of your three middle fingers to do this. Apply light pressure, then medium pressure, then firm pressure. The light pressure will allow you to feel the tissue closest to the skin. The medium pressure will allow you to feel the tissue that is a little deeper. The firm pressure will allow you to feel the tissue close to the ribs. Continue the overlapping circles, moving downward over the breast until you feel your  ribs below your breast. Move one finger-width toward the center of the body. Continue to use the  inch (2 cm) overlapping circles to feel your breast as you move slowly up toward your collarbone. Continue the up and down exam using all three pressures until you reach your armpit.  Write Down What You Find  Write down what is normal for each breast and any changes that you find. Keep a written record with breast changes or normal findings for each breast. By writing this information down, you do not need to depend only on memory for size, tenderness, or location. Write down where you are in your menstrual cycle, if you are still menstruating. If you are having trouble noticing differences in your breasts, do not get discouraged. With time you will become more familiar with the variations in your breasts and more comfortable with the exam. How often should I examine my breasts? Examine your breasts every month. If you are breastfeeding, the best time to examine your breasts is after a feeding or after using a breast pump. If you menstruate, the best time to examine your breasts is 5-7 days after your period is over. During your period, your breasts are lumpier, and it may be more   difficult to notice changes. When should I see my health care provider? See your health care provider if you notice: A change in shape or size of your breasts or nipples. A change in the skin of your breast or nipples, such as a reddened or scaly area. Unusual discharge from your nipples. A lump or thick area that was not there before. Pain in your breasts. Anything that concerns you. Hidradenitis Suppurativa Hidradenitis suppurativa is a long-term (chronic) skin disease. It is similar to a severe form of acne, but it affects areas of the body where acne would be unusual, especially areas of the body where skin rubs against skin and becomes moist. These include: Underarms. Groin. Genital area. Buttocks. Upper  thighs. Breasts. Hidradenitis suppurativa may start out as small lumps or pimples caused by blocked sweat glands or hair follicles. Pimples may develop into deep sores that break open (rupture) and drain pus. Over time, affected areas of skin may thicken and become scarred. This condition is rare and does not spread from person to person (non-contagious). What are the causes? The exact cause of this condition is not known. It may be related to: Female and female hormones. An overactive disease-fighting system (immune system). The immune system may over-react to blocked hair follicles or sweat glands and cause swelling and pus-filled sores. What increases the risk? You are more likely to develop this condition if you: Are female. Are 12-49 years old. Have a family history of hidradenitis suppurativa. Have a personal history of acne. Are overweight. Smoke. Take the medicine lithium. What are the signs or symptoms? The first symptoms are usually painful bumps in the skin, similar to pimples. The condition may get worse over time (progress), or it may only cause mild symptoms. If the disease progresses, symptoms may include: Skin bumps getting bigger and growing deeper into the skin. Bumps rupturing and draining pus. Itchy, infected skin. Skin getting thicker and scarred. Tunnels under the skin (fistulas) where pus drains from a bump. Pain during daily activities, such as pain during walking if your groin area is affected. Emotional problems, such as stress or depression. This condition may affect your appearance and your ability or willingness to wear certain clothes or do certain activities. How is this diagnosed? This condition is diagnosed by a health care provider who specializes in skin diseases (dermatologist). You may be diagnosed based on: Your symptoms and medical history. A physical exam. Testing a pus sample for infection. Blood tests. How is this treated? Your treatment will  depend on how severe your symptoms are. The same treatment will not work for everybody with this condition. You may need to try several treatments to find what works best for you. Treatment may include: Cleaning and bandaging (dressing) your wounds as needed. Lifestyle changes, such as new skin care routines. Taking medicines, such as: Antibiotics. Acne medicines. Medicines to reduce the activity of the immune system. A diabetes medicine (metformin). Birth control pills, for women. Steroids to reduce swelling and pain. Working with a mental health care provider, if you experience emotional distress due to this condition. If you have severe symptoms that do not get better with medicine, you may need surgery. Surgery may involve: Using a laser to clear the skin and remove hair follicles. Opening and draining deep sores. Removing the areas of skin that are diseased and scarred. Follow these instructions at home: Medicines  Take over-the-counter and prescription medicines only as told by your health care provider. If you were prescribed an  antibiotic medicine, take it as told by your health care provider. Do not stop taking the antibiotic even if your condition improves.  Skin care If you have open wounds, cover them with a clean dressing as told by your health care provider. Keep wounds clean by washing them gently with soap and water when you bathe. Do not shave the areas where you get hidradenitis suppurativa. Do not wear deodorant. Wear loose-fitting clothes. Try to avoid getting overheated or sweaty. If you get sweaty or wet, change into clean, dry clothes as soon as you can. To help relieve pain and itchiness, cover sore areas with a warm, clean washcloth (warm compress) for 5-10 minutes as often as needed. If told by your health care provider, take a bleach bath twice a week: Fill your bathtub halfway with water. Pour in  cup of unscented household bleach. Soak in the tub for 5-10  minutes. Only soak from the neck down. Avoid water on your face and hair. Shower to rinse off the bleach from your skin. General instructions Learn as much as you can about your disease so that you have an active role in your treatment. Work closely with your health care provider to find treatments that work for you. If you are overweight, work with your health care provider to lose weight as recommended. Do not use any products that contain nicotine or tobacco, such as cigarettes and e-cigarettes. If you need help quitting, ask your health care provider. If you struggle with living with this condition, talk with your health care provider or work with a mental health care provider as recommended. Keep all follow-up visits as told by your health care provider. This is important. Where to find more information Hidradenitis Lindsay.: https://www.hs-foundation.org/ American Academy of Dermatology: http://www.nguyen-hutchinson.com/ Contact a health care provider if you have: A flare-up of hidradenitis suppurativa. A fever or chills. Trouble controlling your symptoms at home. Trouble doing your daily activities because of your symptoms. Trouble dealing with emotional problems related to your condition. Summary Hidradenitis suppurativa is a long-term (chronic) skin disease. It is similar to a severe form of acne, but it affects areas of the body where acne would be unusual. The first symptoms are usually painful bumps in the skin, similar to pimples. The condition may only cause mild symptoms, or it may get worse over time (progress). If you have open wounds, cover them with a clean dressing as told by your health care provider. Keep wounds clean by washing them gently with soap and water when you bathe. Besides skin care, treatment may include medicines, laser treatment, and surgery. This information is not intended to replace advice given to you by your health care provider. Make sure you  discuss any questions you have with your healthcare provider. Document Revised: 06/03/2020 Document Reviewed: 06/03/2020 Elsevier Patient Education  2022 [employer].

## 2021-03-04 ENCOUNTER — Encounter: Payer: Self-pay | Admitting: Obstetrics and Gynecology

## 2021-03-04 LAB — LIPID PANEL
Cholesterol: 171 mg/dL (ref <200)
HDL: 45 mg/dL — ABNORMAL LOW (ref >=50)
LDL Cholesterol (Calc): 112 mg/dL (calc) — ABNORMAL HIGH
Non-HDL Cholesterol (Calc): 126 mg/dL (calc) (ref <130)
Total CHOL/HDL Ratio: 3.8 (calc) (ref <5.0)
Triglycerides: 58 mg/dL (ref <150)

## 2021-03-04 LAB — CBC
HCT: 39.1 % (ref 35.0–45.0)
Hemoglobin: 12.6 g/dL (ref 11.7–15.5)
MCH: 29.3 pg (ref 27.0–33.0)
MCHC: 32.2 g/dL (ref 32.0–36.0)
MCV: 90.9 fL (ref 80.0–100.0)
MPV: 8.6 fL (ref 7.5–12.5)
Platelets: 466 10*3/uL — ABNORMAL HIGH (ref 140–400)
RBC: 4.3 10*6/uL (ref 3.80–5.10)
RDW: 14.2 % (ref 11.0–15.0)
WBC: 8.7 10*3/uL (ref 3.8–10.8)

## 2021-03-04 LAB — HEMOGLOBIN A1C
Hgb A1c MFr Bld: 5.4 % of total Hgb (ref <5.7)
Mean Plasma Glucose: 108 mg/dL
eAG (mmol/L): 6 mmol/L

## 2021-03-04 LAB — TSH: TSH: 1.15 mIU/L

## 2021-03-10 LAB — CYTOLOGY - PAP

## 2021-03-17 ENCOUNTER — Ambulatory Visit: Payer: PRIVATE HEALTH INSURANCE | Admitting: Obstetrics and Gynecology

## 2021-04-08 ENCOUNTER — Ambulatory Visit (INDEPENDENT_AMBULATORY_CARE_PROVIDER_SITE_OTHER): Payer: PRIVATE HEALTH INSURANCE | Admitting: Obstetrics and Gynecology

## 2021-04-08 ENCOUNTER — Other Ambulatory Visit (HOSPITAL_COMMUNITY)
Admission: RE | Admit: 2021-04-08 | Discharge: 2021-04-08 | Disposition: A | Discharge status: Home / Self Care | Payer: Self-pay | Source: Ambulatory Visit | Attending: Obstetrics and Gynecology | Admitting: Obstetrics and Gynecology

## 2021-04-08 ENCOUNTER — Other Ambulatory Visit: Payer: Self-pay

## 2021-04-08 ENCOUNTER — Encounter: Payer: Self-pay | Admitting: Obstetrics and Gynecology

## 2021-04-08 VITALS — BP 110/80 | HR 78 | Resp 16 | Wt 239.0 lb

## 2021-04-08 DIAGNOSIS — L732 Hidradenitis suppurativa: Secondary | ICD-10-CM

## 2021-04-08 DIAGNOSIS — N870 Mild cervical dysplasia: Secondary | ICD-10-CM

## 2021-04-08 DIAGNOSIS — R87612 Low grade squamous intraepithelial lesion on cytologic smear of cervix (LGSIL): Secondary | ICD-10-CM

## 2021-04-08 DIAGNOSIS — Z01812 Encounter for preprocedural laboratory examination: Secondary | ICD-10-CM

## 2021-04-08 LAB — PREGNANCY, URINE: Preg Test, Ur: NEGATIVE

## 2021-04-08 MED ORDER — DOXYCYCLINE HYCLATE 100 MG PO CAPS: 100.0000 mg | ORAL_CAPSULE | Freq: Every day | ORAL | 0 refills | Status: AC

## 2021-04-08 NOTE — Patient Instructions (Signed)
Hidradenitis Suppurativa Hidradenitis suppurativa is a long-term (chronic) skin disease. It is similar to a severe form of acne, but it affects areas of the body where acne would be unusual, especially areas of the body where skin rubs against skin and becomes moist. These include: Underarms. Groin. Genital area. Buttocks. Upper thighs. Breasts. Hidradenitis suppurativa may start out as small lumps or pimples caused by blocked sweat glands or hair follicles. Pimples may develop into deep sores that break open (rupture) and drain pus. Over time, affected areas of skin may thicken and become scarred. This condition is rare and does not spread from person to person (non-contagious). What are the causes? The exact cause of this condition is not known. It may be related to: Female and female hormones. An overactive disease-fighting system (immune system). The immune system may over-react to blocked hair follicles or sweat glands and cause swelling and pus-filled sores. What increases the risk? You are more likely to develop this condition if you: Are female. Are 22-21 years old. Have a family history of hidradenitis suppurativa. Have a personal history of acne. Are overweight. Smoke. Take the medicine lithium. What are the signs or symptoms? The first symptoms are usually painful bumps in the skin, similar to pimples. The condition may get worse over time (progress), or it may only cause mild symptoms. If the disease progresses, symptoms may include: Skin bumps getting bigger and growing deeper into the skin. Bumps rupturing and draining pus. Itchy, infected skin. Skin getting thicker and scarred. Tunnels under the skin (fistulas) where pus drains from a bump. Pain during daily activities, such as pain during walking if your groin area is affected. Emotional problems, such as stress or depression. This condition may affect your appearance and your ability or willingness to wear certain clothes  or do certain activities. How is this diagnosed? This condition is diagnosed by a health care provider who specializes in skin diseases (dermatologist). You may be diagnosed based on: Your symptoms and medical history. A physical exam. Testing a pus sample for infection. Blood tests. How is this treated? Your treatment will depend on how severe your symptoms are. The same treatment will not work for everybody with this condition. You may need to try several treatments to find what works best for you. Treatment may include: Cleaning and bandaging (dressing) your wounds as needed. Lifestyle changes, such as new skin care routines. Taking medicines, such as: Antibiotics. Acne medicines. Medicines to reduce the activity of the immune system. A diabetes medicine (metformin). Birth control pills, for women. Steroids to reduce swelling and pain. Working with a mental health care provider, if you experience emotional distress due to this condition. If you have severe symptoms that do not get better with medicine, you may need surgery. Surgery may involve: Using a laser to clear the skin and remove hair follicles. Opening and draining deep sores. Removing the areas of skin that are diseased and scarred. Follow these instructions at home: Medicines  Take over-the-counter and prescription medicines only as told by your health care provider. If you were prescribed an antibiotic medicine, take it as told by your health care provider. Do not stop taking the antibiotic even if your condition improves.  Skin care If you have open wounds, cover them with a clean dressing as told by your health care provider. Keep wounds clean by washing them gently with soap and water when you bathe. Do not shave the areas where you get hidradenitis suppurativa. Do not wear deodorant. Wear  loose-fitting clothes. Try to avoid getting overheated or sweaty. If you get sweaty or wet, change into clean, dry clothes as  soon as you can. To help relieve pain and itchiness, cover sore areas with a warm, clean washcloth (warm compress) for 5-10 minutes as often as needed. If told by your health care provider, take a bleach bath twice a week: Fill your bathtub halfway with water. Pour in  cup of unscented household bleach. Soak in the tub for 5-10 minutes. Only soak from the neck down. Avoid water on your face and hair. Shower to rinse off the bleach from your skin. General instructions Learn as much as you can about your disease so that you have an active role in your treatment. Work closely with your health care provider to find treatments that work for you. If you are overweight, work with your health care provider to lose weight as recommended. Do not use any products that contain nicotine or tobacco, such as cigarettes and e-cigarettes. If you need help quitting, ask your health care provider. If you struggle with living with this condition, talk with your health care provider or work with a mental health care provider as recommended. Keep all follow-up visits as told by your health care provider. This is important. Where to find more information Hidradenitis [place].: https://www.hs-foundation.org/ American Academy of Dermatology: http://www.nguyen-hutchinson.com/ Contact a health care provider if you have: A flare-up of hidradenitis suppurativa. A fever or chills. Trouble controlling your symptoms at home. Trouble doing your daily activities because of your symptoms. Trouble dealing with emotional problems related to your condition. Summary Hidradenitis suppurativa is a long-term (chronic) skin disease. It is similar to a severe form of acne, but it affects areas of the body where acne would be unusual. The first symptoms are usually painful bumps in the skin, similar to pimples. The condition may only cause mild symptoms, or it may get worse over time (progress). If you have open wounds, cover  them with a clean dressing as told by your health care provider. Keep wounds clean by washing them gently with soap and water when you bathe. Besides skin care, treatment may include medicines, laser treatment, and surgery. This information is not intended to replace advice given to you by your health care provider. Make sure you discuss any questions you have with your healthcare provider. Document Revised: 06/03/2020 Document Reviewed: 06/03/2020 Elsevier Patient Education  2022 [employer].

## 2021-04-08 NOTE — Progress Notes (Signed)
GYNECOLOGY  VISIT   HPI: 25 y.o.   Single Black or African American Not Hispanic or Latino  female   [id number] with Patient's last menstrual period was 03/28/2021 (exact date).   here for colposcopy for a LSIL pap.   She c/o recurrent boils on her upper thighs and bilateral axilla. She has never had any treatment for this.   GYNECOLOGIC HISTORY: Patient's last menstrual period was 03/28/2021 (exact date). Contraception:none  Menopausal hormone therapy: none         OB History     Gravida  1   Para      Term      Preterm      AB  1   Living         SAB  1   IAB      Ectopic      Multiple      Live Births                 There are no problems to display for this patient.   Past Medical History:  Diagnosis Date   Anemia    Blood transfusion without reported diagnosis    Depression    Dysmenorrhea    Fibroid     Past Surgical History:  Procedure Laterality Date   DILATION AND CURETTAGE OF UTERUS     KNEE SURGERY Left     Current Outpatient Medications  Medication Sig Dispense Refill   ibuprofen (ADVIL) 200 MG tablet Take 200 mg by mouth every 6 (six) hours as needed for moderate pain or mild pain.     No current facility-administered medications for this visit.     ALLERGIES: Patient has no known allergies.  Family History  Problem Relation Age of Onset   Healthy Mother    Healthy Father     Social History   Socioeconomic History   Marital status: Single    Spouse name: Not on file   Number of children: Not on file   Years of education: Not on file   Highest education level: Not on file  Occupational History   Not on file  Tobacco Use   Smoking status: Every Day    Types: Cigars   Smokeless tobacco: Never   Tobacco comments:    black and milds  Vaping Use   Vaping Use: Never used  Substance and Sexual Activity   Alcohol use: Not Currently   Drug use: Never   Sexual activity: Yes    Partners: Male    Birth  control/protection: Condom    Comment: condoms occ  Other Topics Concern   Not on file  Social History Narrative   Not on file   Social Determinants of Health   Financial Resource Strain: Not on file  Food Insecurity: Not on file  Transportation Needs: Not on file  Physical Activity: Not on file  Stress: Not on file  Social Connections: Not on file  Intimate Partner Violence: Not on file    ROS  PHYSICAL EXAMINATION:    BP 110/80   Pulse 78   Resp 16   Wt 239 lb (108.4 kg)   LMP 03/28/2021 (Exact Date)   BMI 39.77 kg/m     General appearance: alert, cooperative and appears stated age Axilla: bilateral scarring of the tissue, boil in the left axilla. Skin: bilateral upper inner thighs with multiple scars from prior boils. Active boil on the upper, inner left thigh.   Pelvic: External genitalia:  no  lesions              Urethra:  normal appearing urethra with no masses, tenderness or lesions              Bartholins and Skenes: normal                 Vagina: normal appearing vagina with normal color and discharge, no lesions              Cervix:  no gross lesions               Colposcopy: unsatisfactory, mild aceto-white changes at 3 o'clock, biopsy done. Negative lugols examination of the upper vagina. ECC done.   Chaperone was present for exam.   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 Pre-procedure lab exam - Pregnancy, urine  3. Hidradenitis suppurativa Hur[place] stage II -information given in mychart - doxycycline (VIBRAMYCIN) 100 MG capsule; Take 1 capsule (100 mg total) by mouth daily. Take BID for 14 days.  Take with food as can cause GI distress.  Dispense: 90 capsule; Refill: 0 -given the extent of disease, recommended she establish care with Dermatology. F/u either here of with Dermatology in 3 months.

## 2021-04-09 LAB — SURGICAL PATHOLOGY

## 2021-05-04 ENCOUNTER — Ambulatory Visit (INDEPENDENT_AMBULATORY_CARE_PROVIDER_SITE_OTHER): Payer: PRIVATE HEALTH INSURANCE

## 2021-05-04 ENCOUNTER — Other Ambulatory Visit: Payer: Self-pay

## 2021-05-04 DIAGNOSIS — Z23 Encounter for immunization: Secondary | ICD-10-CM | POA: Diagnosis not present

## 2021-05-04 NOTE — Progress Notes (Signed)
Patient in today for 2nd Gardasil injection.   Contraception: none  LMP: 04/29/21 Last AEX: 03/03/21 with JJ  Injection given in Right Deltoid. Patient tolerated shot well.   Patient informed next injection due in about 4 months.  Advised patient, if not on birth control, to return for next injection with cycle.   Routed to provider for final review.  Encounter closed.

## 2021-09-03 ENCOUNTER — Other Ambulatory Visit: Payer: Self-pay

## 2021-09-03 ENCOUNTER — Ambulatory Visit: Payer: PRIVATE HEALTH INSURANCE

## 2022-02-26 NOTE — Progress Notes (Deleted)
26 y.o. G1P0010 Single Black or African American Not Hispanic or Latino female here for annual exam.      No LMP recorded.          Sexually active: {yes no:314532}  The current method of family planning is {contraception:315051}.    Exercising: {yes no:314532}  {types:19826} Smoker:  {YES [id number]  Health Maintenance: Pap:  03/03/21 LSIL  History of abnormal Pap:  yes had colpo 04/08/21  MMG:  none  BMD:   none  Colonoscopy: none  TDaP:  *** Gardasil: x2   reports that she has been smoking cigars. She has never used smokeless tobacco. She reports that she does not currently use alcohol. She reports that she does not use drugs.  Past Medical History:  Diagnosis Date   Anemia    Blood transfusion without reported diagnosis    Depression    Dysmenorrhea    Fibroid     Past Surgical History:  Procedure Laterality Date   DILATION AND CURETTAGE OF UTERUS     KNEE SURGERY Left     Current Outpatient Medications  Medication Sig Dispense Refill   doxycycline (VIBRAMYCIN) 100 MG capsule Take 1 capsule (100 mg total) by mouth daily. Take BID for 14 days.  Take with food as can cause GI distress. 90 capsule 0   ibuprofen (ADVIL) 200 MG tablet Take 200 mg by mouth every 6 (six) hours as needed for moderate pain or mild pain.     No current facility-administered medications for this visit.    Family History  Problem Relation Age of Onset   Healthy Mother    Healthy Father     Review of Systems  Exam:   There were no vitals taken for this visit.  Weight change: '@WEIGHTCHANGE'$ @ Height:      Ht Readings from Last 3 Encounters:  03/03/21 '5\' 5"'$  (1.651 m)  02/16/21 '5\' 5"'$  (1.651 m)  12/21/19 '5\' 5"'$  (1.651 m)    General appearance: alert, cooperative and appears stated age Head: Normocephalic, without obvious abnormality, atraumatic Neck: no adenopathy, supple, symmetrical, trachea midline and thyroid {CHL AMB PHY EX THYROID NORM DEFAULT:[phone]::"normal to inspection and  palpation"} Lungs: clear to auscultation bilaterally Cardiovascular: regular rate and rhythm Breasts: {Exam; breast:13139::"normal appearance, no masses or tenderness"} Abdomen: soft, non-tender; non distended,  no masses,  no organomegaly Extremities: extremities normal, atraumatic, no cyanosis or edema Skin: Skin color, texture, turgor normal. No rashes or lesions Lymph nodes: Cervical, supraclavicular, and axillary nodes normal. No abnormal inguinal nodes palpated Neurologic: Grossly normal   Pelvic: External genitalia:  no lesions              Urethra:  normal appearing urethra with no masses, tenderness or lesions              Bartholins and Skenes: normal                 Vagina: normal appearing vagina with normal color and discharge, no lesions              Cervix: {CHL AMB PHY EX CERVIX NORM DEFAULT:[phone]::"no lesions"}               Bimanual Exam:  Uterus:  {CHL AMB PHY EX UTERUS NORM DEFAULT:[phone]::"normal size, contour, position, consistency, mobility, non-tender"}              Adnexa: {CHL AMB PHY EX ADNEXA NO MASS DEFAULT:[id number]::"no mass, fullness, tenderness"}  Rectovaginal: Confirms               Anus:  normal sphincter tone, no lesions  *** chaperoned for the exam.  A:  Well Woman with normal exam  P:

## 2022-03-04 ENCOUNTER — Ambulatory Visit: Payer: PRIVATE HEALTH INSURANCE | Admitting: Obstetrics and Gynecology

## 2022-03-04 DIAGNOSIS — Z0289 Encounter for other administrative examinations: Secondary | ICD-10-CM

## 2022-09-21 IMAGING — US US PELVIS COMPLETE TRANSABD/TRANSVAG W DUPLEX
1 series · 15 of 25 positions shown · non-contrast
Comparison: None.

CLINICAL DATA: Pelvic pain in female.

EXAM:
TRANSABDOMINAL AND TRANSVAGINAL ULTRASOUND OF PELVIS
DOPPLER ULTRASOUND OF OVARIES
TECHNIQUE: Both transabdominal and transvaginal ultrasound examinations of the
pelvis were performed. Transabdominal technique was performed for
global imaging of the pelvis including uterus, ovaries, adnexal
regions, and pelvic cul-de-sac.
It was necessary to proceed with endovaginal exam following the
transabdominal exam to visualize the ovaries. Color and duplex
Doppler ultrasound was utilized to evaluate blood flow to the
ovaries.

[Series 1: us transvaginal non-ob mc & wl · 66 acquisitions, 15 frames shown]
[im 1/66]
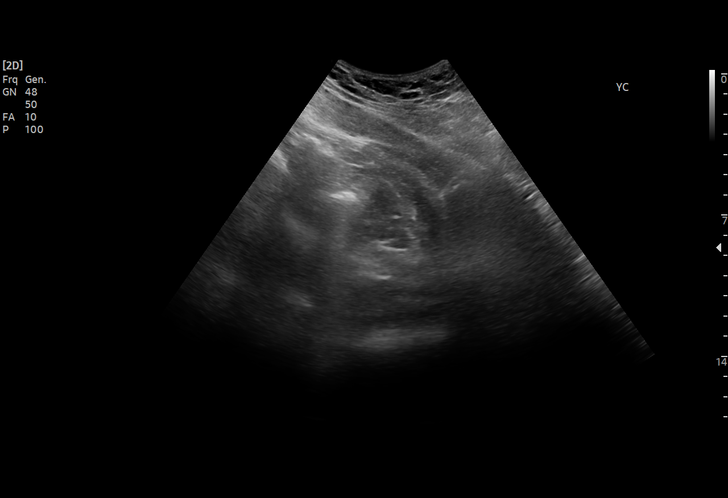
[im 6/66]
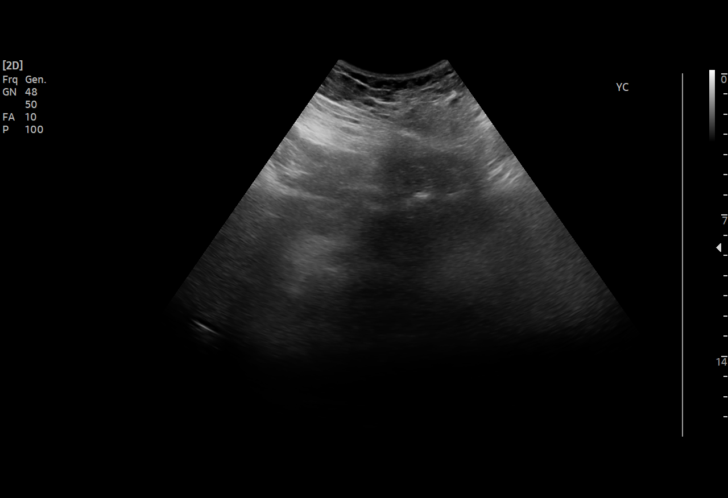
[im 11/66]
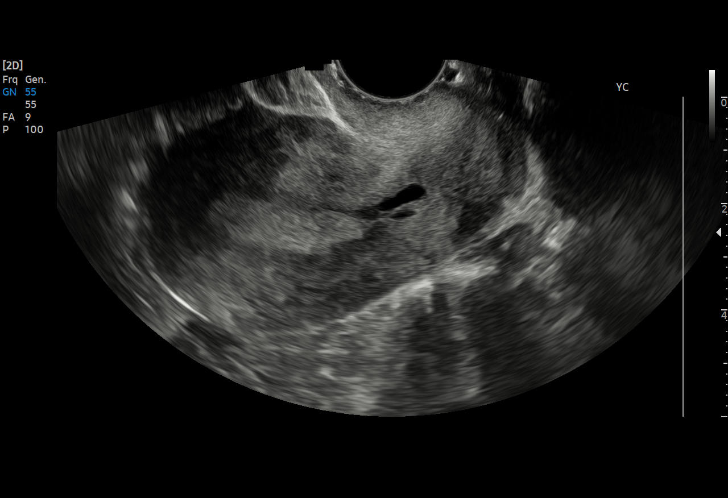
[im 14/66]
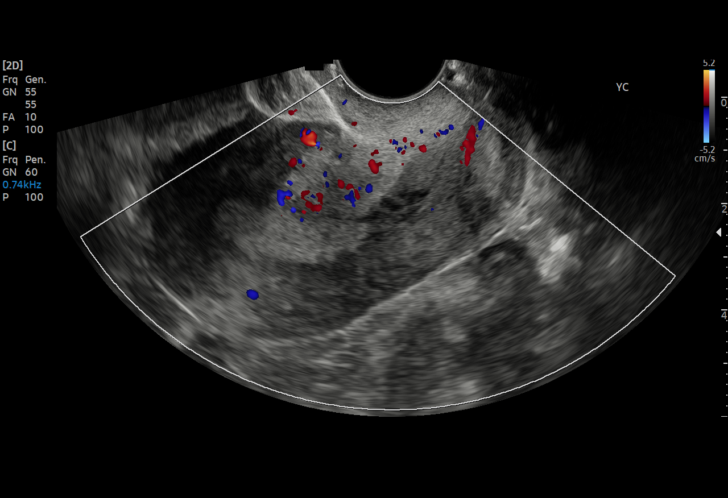
[im 19/66]
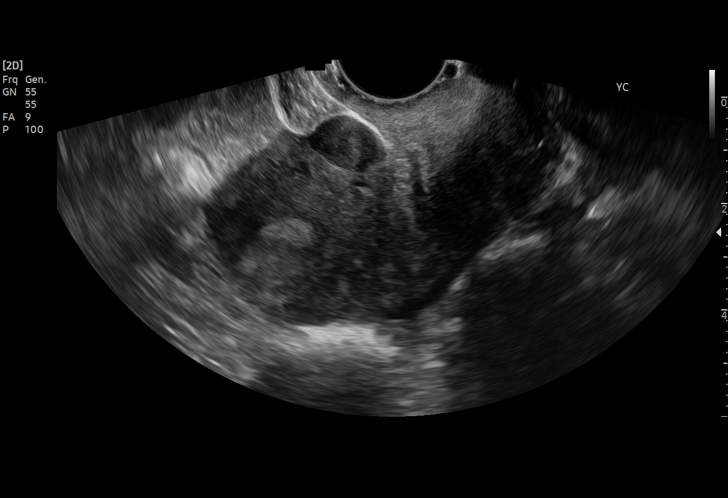
[im 25/66]
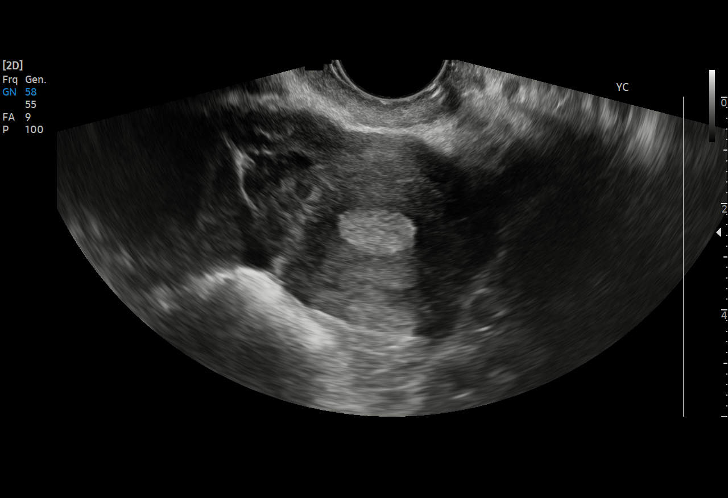
[im 28/66]
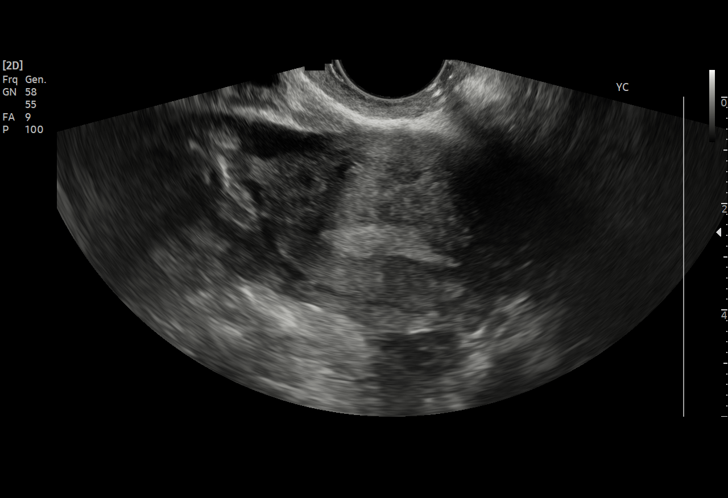
[im 33/66]
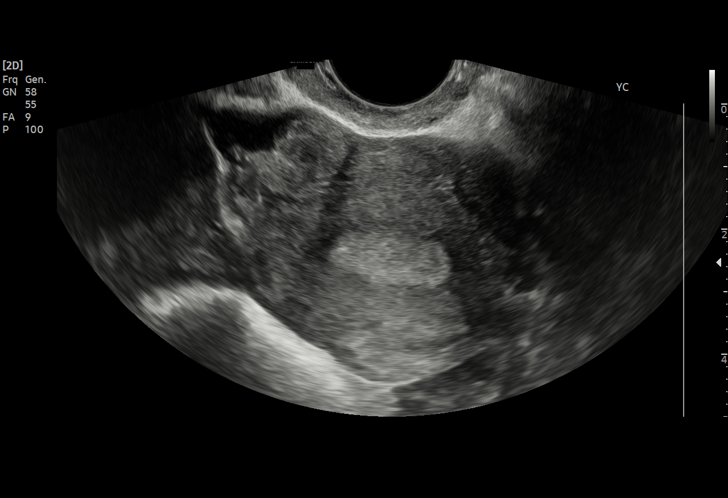
[im 38/66]
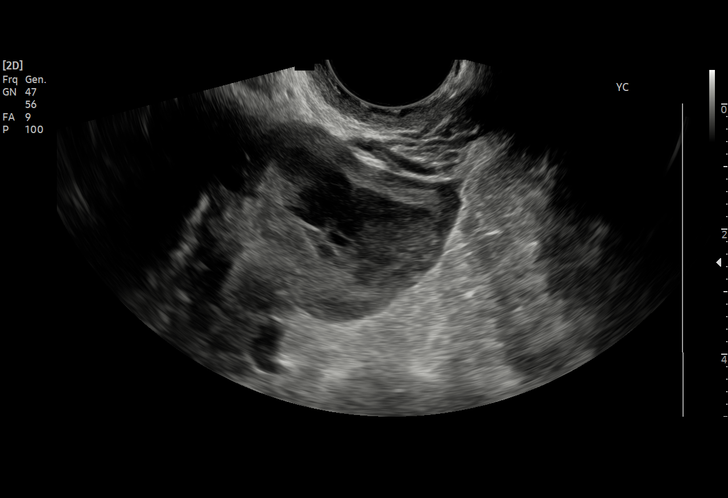
[im 41/66]
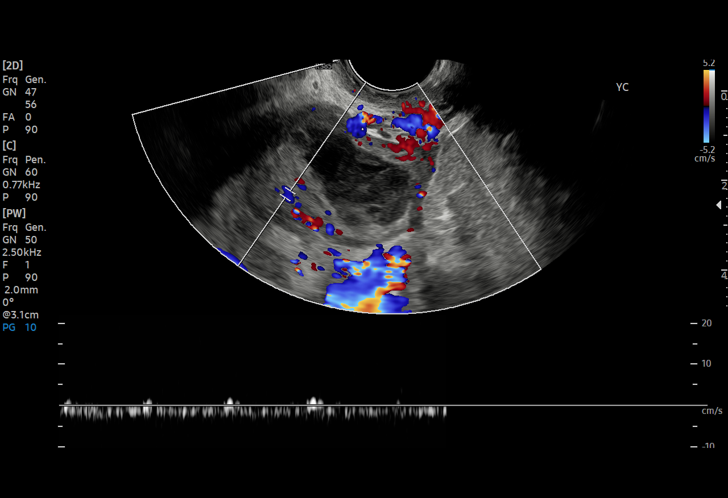
[im 47/66]
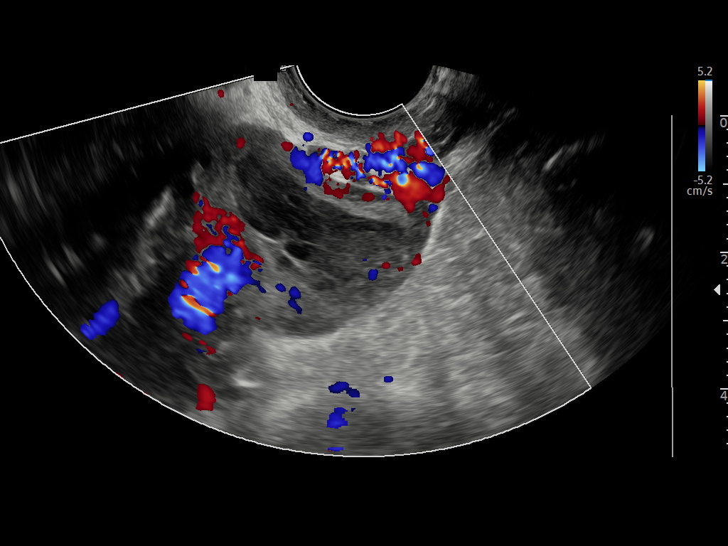
[im 52/66]
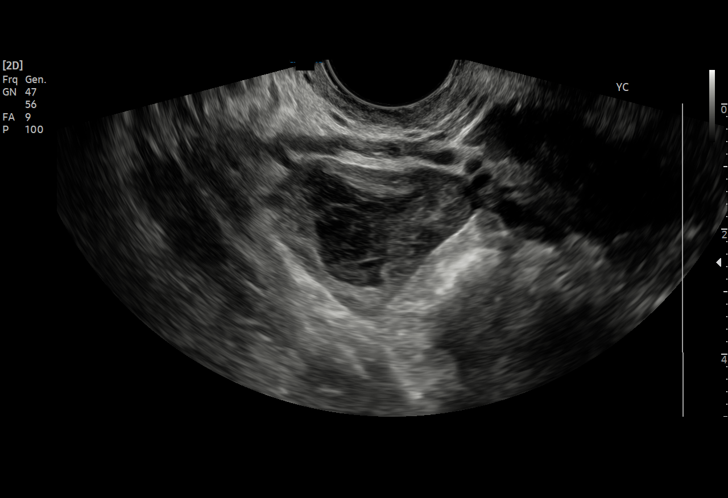
[im 55/66]
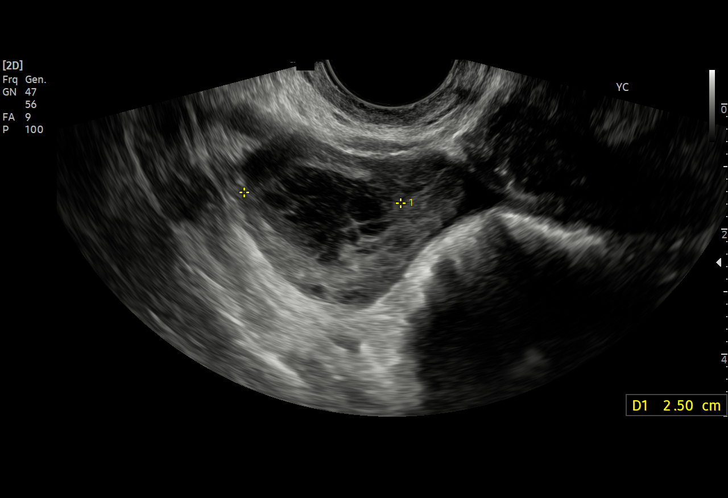
[im 60/66]
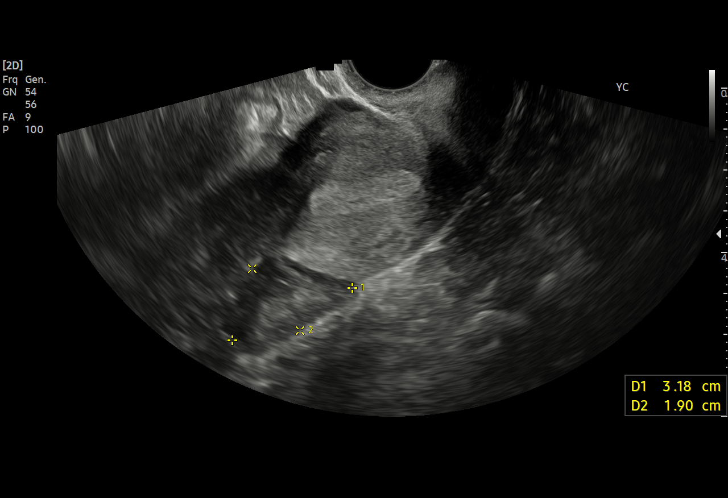
[im 66/66]
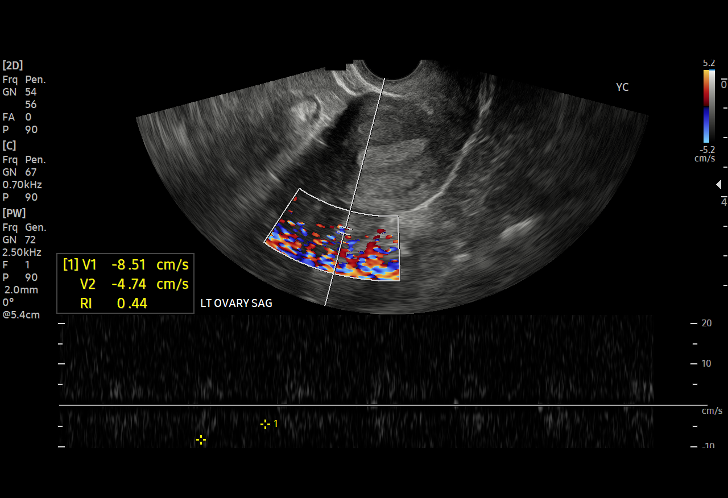

[15 of 25 positions shown; findings below may reference images not displayed]

FINDINGS: The technologist worksheet is not available for review.

Uterus

Measurements: 8 x 4 x 4 cm = volume: 70 mL. Intramural hypoechoic
mass consistent with fibroid in the right body, 13 mm in diameter

Endometrium

Thickness: 9 mm.  No focal abnormality visualized.

Right ovary

Measurements: 44 x 32 x 34 mm = volume: 24 mL. 2.5 cm Crenulated
hypoechoic structure with internal lace-like architecture. There is
an intermittently seen echogenic rim.

Left ovary

Measurements: 32 x 19 x 19 mm = volume: 6 mL. Normal appearance/no
adnexal mass.

Pulsed Doppler evaluation of both ovaries demonstrates normal
low-resistance arterial and venous waveforms.

Other findings

No abnormal free fluid.
IMPRESSION: 1. No acute finding.
2. Hemorrhagic corpus luteum on the right.
3. 13 mm intramural fibroid.
